# Patient Record
Sex: Female | Born: 1995 | Race: White | Hispanic: No | Marital: Single
Health system: Southern US, Community
[De-identification: ages and names within clinical notes are randomized; demographics above are authoritative.]

## PROBLEM LIST (undated history)

## (undated) HISTORY — PX: MENISCUS REPAIR: SHX5179

## (undated) HISTORY — PX: ARTHROSCOPIC REPAIR ACL: SUR80

---
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
## 2015-01-20 IMAGING — CR DG FINGER MIDDLE 2+V*R*
1 series · 1 of 1 positions shown · non-contrast
Comparison: None.

CLINICAL DATA: Finger injury, tenderness at PIP joint

RIGHT MIDDLE FINGER 2+V

[view not recorded]
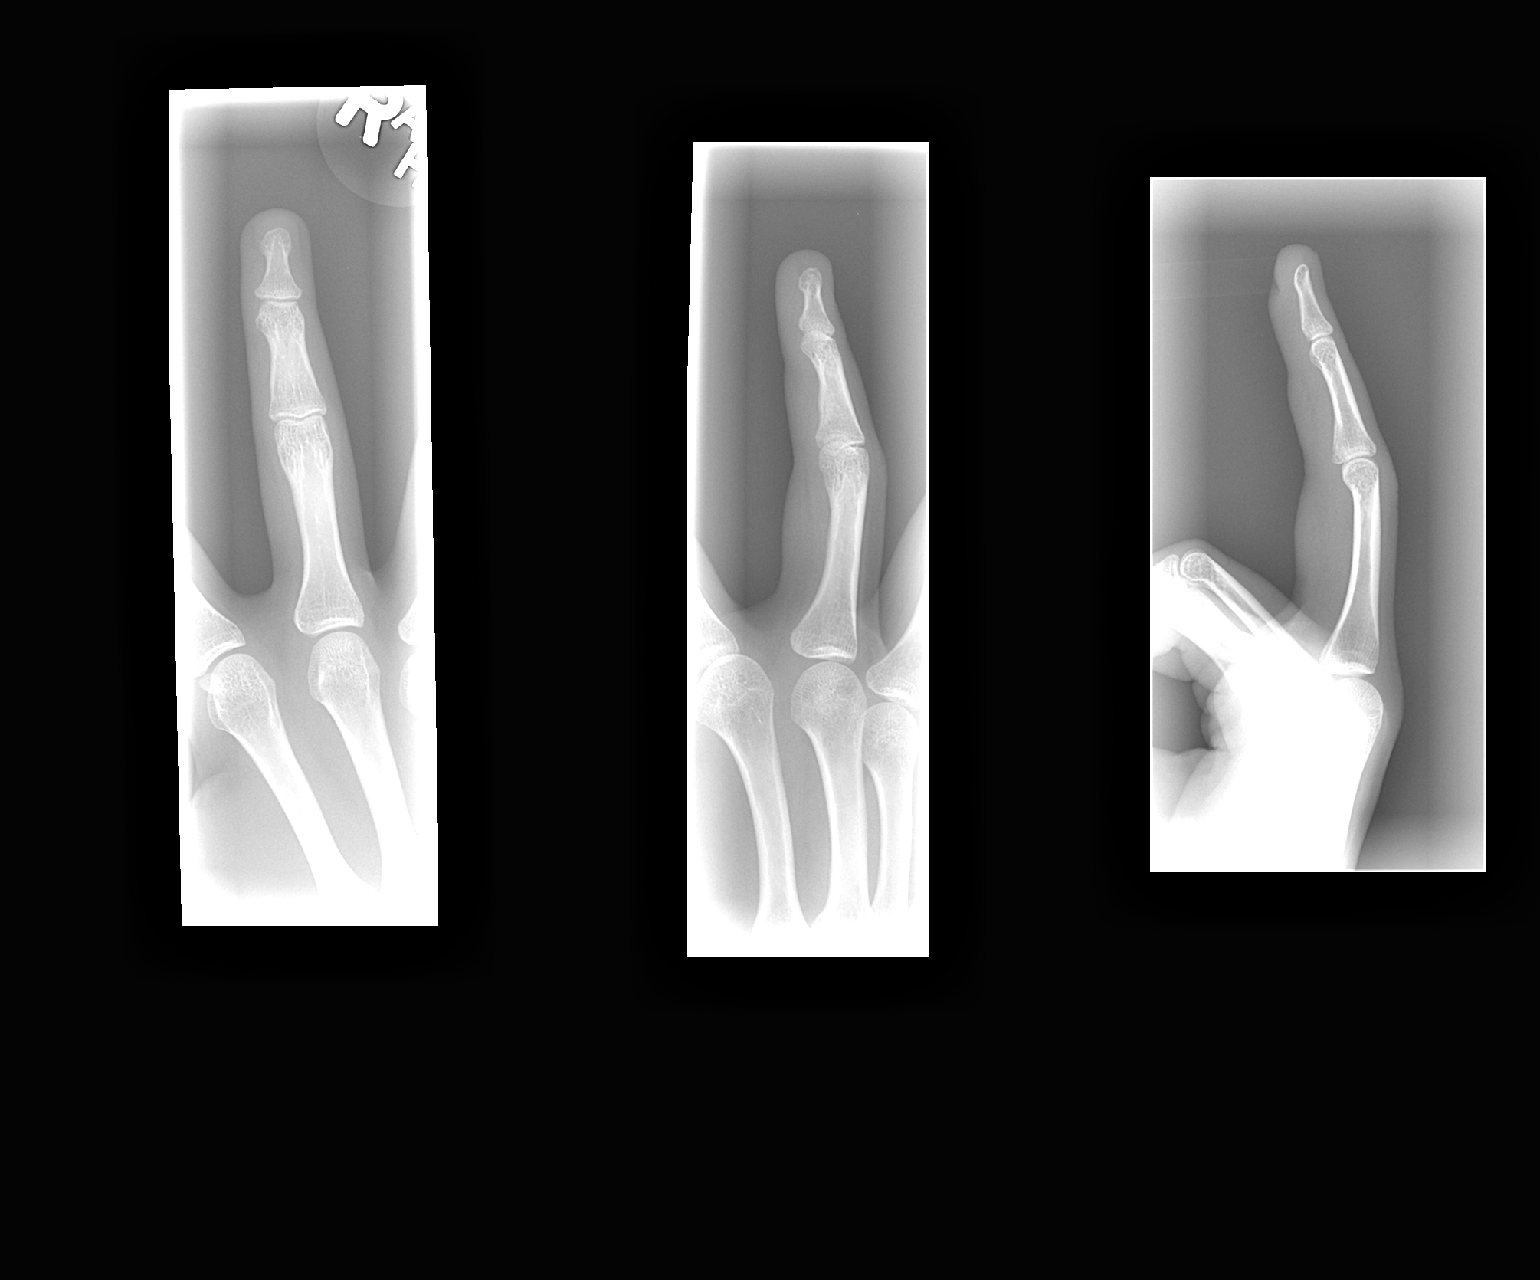

[1 of 1 positions shown; findings below may reference images not displayed]

FINDINGS: No fracture or dislocation is seen.

The joint spaces are preserved.

Possible mild soft tissue swelling along the ventral aspect of the
proximal phalanx.
IMPRESSION: No fracture or dislocation is seen.
